# Patient Record
Sex: Male | Born: 1955 | Race: White | Hispanic: No | Marital: Married
Health system: Southern US, Community
[De-identification: ages and names within clinical notes are randomized; demographics above are authoritative.]

---

## 2010-01-25 ENCOUNTER — Encounter: Admission: RE | Admit: 2010-01-25 | Discharge: 2010-01-25 | Payer: Self-pay | Admitting: Unknown Physician Specialty

## 2011-08-07 IMAGING — CR DG KNEE 1-2V*R*
2 series · 2 of 2 positions shown · non-contrast
Comparison: None

CLINICAL DATA: Knee pain

RIGHT KNEE - 1-2 VIEW

[view not recorded (1 of 2)]
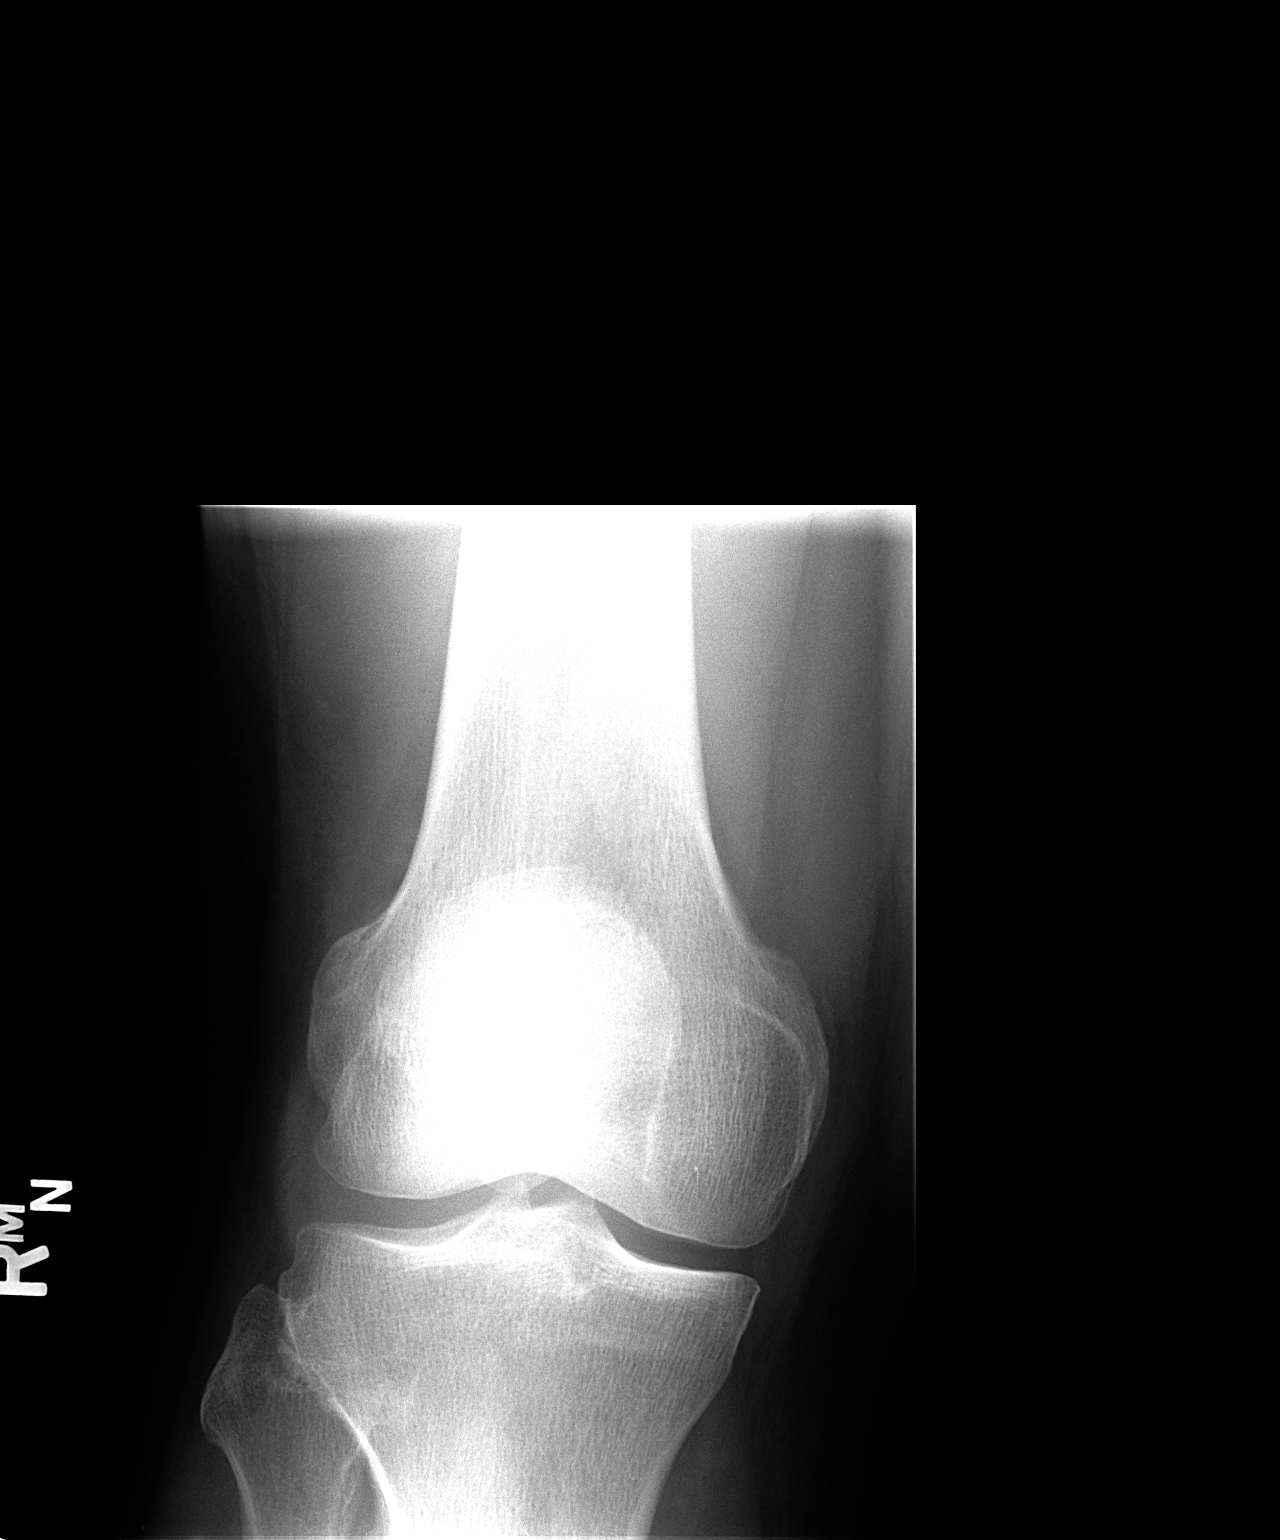

[view not recorded (2 of 2)]
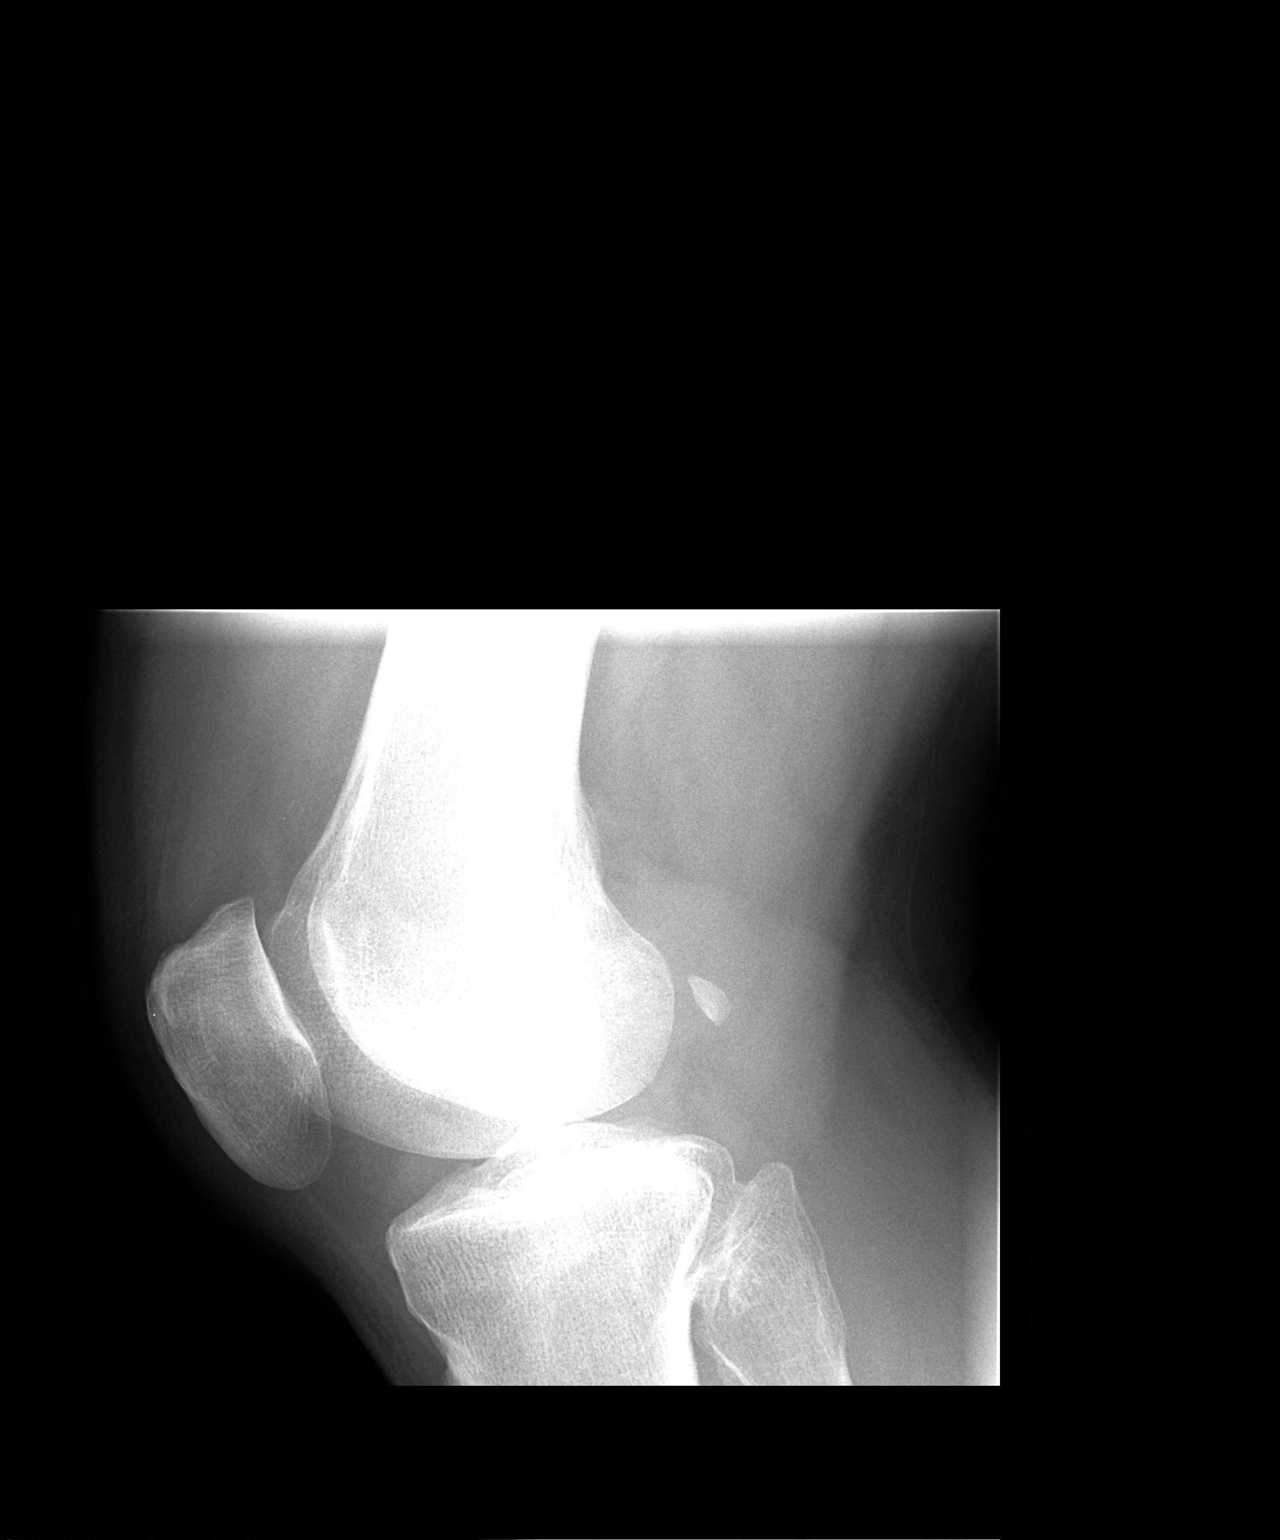

[2 of 2 positions shown; findings below may reference images not displayed]

FINDINGS: There is no joint effusion.

There is no fracture or dislocation identified.

No radiopaque foreign bodies or soft tissue calcifications.
Sharpening of the tibial spines identified.  There is also mild
subchondral sclerosis within the medial compartment.
IMPRESSION: 1.  No acute findings.
2.  Mild degenerative change.

## 2020-03-05 ENCOUNTER — Ambulatory Visit: Payer: Self-pay | Attending: Internal Medicine

## 2020-03-05 DIAGNOSIS — Z23 Encounter for immunization: Secondary | ICD-10-CM

## 2020-03-05 NOTE — Progress Notes (Signed)
   [facility]  Name:  Todd Charles    MRN: [id number] DOB: Apr 02, 1956  03/05/2020  [name] was observed post Covid-19 immunization for 15 minutes without incident. He was provided with Vaccine Information Sheet and instruction to access the V-Safe system.   [name] was instructed to call [phone] with any severe reactions post vaccine: Marland Kitchen Difficulty breathing  . Swelling of face and throat  . A fast heartbeat  . A bad rash all over body  . Dizziness and weakness   Immunizations Administered    No immunizations on file.
